# Patient Record
Sex: Female | Born: 1970 | Race: White | Hispanic: No | Marital: Single | Smoking: Former smoker
Health system: Southern US, Community
[De-identification: ages and names within clinical notes are randomized; demographics above are authoritative.]

## PROBLEM LIST (undated history)

## (undated) HISTORY — PX: APPENDECTOMY: SHX54

## (undated) HISTORY — PX: TONSILLECTOMY: SUR1361

## (undated) HISTORY — PX: TUBAL LIGATION: SHX77

---

## 2007-06-06 ENCOUNTER — Ambulatory Visit (HOSPITAL_COMMUNITY): Admission: RE | Admit: 2007-06-06 | Discharge: 2007-06-06 | Payer: Self-pay | Admitting: Occupational Medicine

## 2008-10-29 ENCOUNTER — Encounter: Payer: Self-pay | Admitting: *Deleted

## 2015-08-25 ENCOUNTER — Other Ambulatory Visit: Payer: Self-pay | Admitting: Obstetrics & Gynecology

## 2015-08-25 ENCOUNTER — Other Ambulatory Visit (HOSPITAL_COMMUNITY)
Admission: RE | Admit: 2015-08-25 | Discharge: 2015-08-25 | Disposition: A | Discharge status: Home / Self Care | Payer: 59 | Source: Ambulatory Visit | Attending: Obstetrics & Gynecology | Admitting: Obstetrics & Gynecology

## 2015-08-25 DIAGNOSIS — Z1151 Encounter for screening for human papillomavirus (HPV): Secondary | ICD-10-CM | POA: Diagnosis present

## 2015-08-25 DIAGNOSIS — Z01419 Encounter for gynecological examination (general) (routine) without abnormal findings: Secondary | ICD-10-CM | POA: Insufficient documentation

## 2015-08-29 LAB — CYTOLOGY - PAP

## 2015-09-08 ENCOUNTER — Other Ambulatory Visit: Payer: Self-pay

## 2015-09-08 DIAGNOSIS — Z1231 Encounter for screening mammogram for malignant neoplasm of breast: Secondary | ICD-10-CM

## 2015-09-23 ENCOUNTER — Ambulatory Visit
Admission: RE | Admit: 2015-09-23 | Discharge: 2015-09-23 | Disposition: A | Discharge status: Home / Self Care | Payer: 59 | Source: Ambulatory Visit

## 2015-09-23 DIAGNOSIS — Z1231 Encounter for screening mammogram for malignant neoplasm of breast: Secondary | ICD-10-CM

## 2016-12-04 ENCOUNTER — Other Ambulatory Visit: Payer: Self-pay | Admitting: Internal Medicine

## 2016-12-04 DIAGNOSIS — Z1231 Encounter for screening mammogram for malignant neoplasm of breast: Secondary | ICD-10-CM

## 2016-12-06 ENCOUNTER — Ambulatory Visit
Admission: RE | Admit: 2016-12-06 | Discharge: 2016-12-06 | Disposition: A | Discharge status: Home / Self Care | Payer: 59 | Source: Ambulatory Visit | Attending: Internal Medicine | Admitting: Internal Medicine

## 2016-12-06 DIAGNOSIS — Z1231 Encounter for screening mammogram for malignant neoplasm of breast: Secondary | ICD-10-CM

## 2017-07-16 ENCOUNTER — Emergency Department (HOSPITAL_COMMUNITY): Payer: Managed Care, Other (non HMO)

## 2017-07-16 ENCOUNTER — Other Ambulatory Visit: Payer: Self-pay

## 2017-07-16 ENCOUNTER — Emergency Department (HOSPITAL_COMMUNITY)
Admission: EM | Admit: 2017-07-16 | Discharge: 2017-07-16 | Disposition: A | Discharge status: Home / Self Care | Payer: Managed Care, Other (non HMO) | Attending: Emergency Medicine | Admitting: Emergency Medicine

## 2017-07-16 ENCOUNTER — Encounter (HOSPITAL_COMMUNITY): Payer: Self-pay | Admitting: Emergency Medicine

## 2017-07-16 DIAGNOSIS — J111 Influenza due to unidentified influenza virus with other respiratory manifestations: Secondary | ICD-10-CM | POA: Insufficient documentation

## 2017-07-16 DIAGNOSIS — J029 Acute pharyngitis, unspecified: Secondary | ICD-10-CM | POA: Diagnosis not present

## 2017-07-16 DIAGNOSIS — R0602 Shortness of breath: Secondary | ICD-10-CM | POA: Diagnosis present

## 2017-07-16 DIAGNOSIS — R6889 Other general symptoms and signs: Secondary | ICD-10-CM

## 2017-07-16 LAB — CBC WITH DIFFERENTIAL/PLATELET
BASOS ABS: 0 10*3/uL (ref 0.0–0.1)
BASOS PCT: 0 %
Eosinophils Absolute: 0.1 10*3/uL (ref 0.0–0.7)
Eosinophils Relative: 2 %
HEMATOCRIT: 36 % (ref 36.0–46.0)
HEMOGLOBIN: 11.6 g/dL — AB (ref 12.0–15.0)
Lymphocytes Relative: 34 %
Lymphs Abs: 2.2 10*3/uL (ref 0.7–4.0)
MCH: 26 pg (ref 26.0–34.0)
MCHC: 32.2 g/dL (ref 30.0–36.0)
MCV: 80.7 fL (ref 78.0–100.0)
MONOS PCT: 5 %
Monocytes Absolute: 0.3 10*3/uL (ref 0.1–1.0)
NEUTROS ABS: 3.9 10*3/uL (ref 1.7–7.7)
NEUTROS PCT: 59 %
Platelets: 243 10*3/uL (ref 150–400)
RBC: 4.46 MIL/uL (ref 3.87–5.11)
RDW: 15.9 % — ABNORMAL HIGH (ref 11.5–15.5)
WBC: 6.5 10*3/uL (ref 4.0–10.5)

## 2017-07-16 LAB — RAPID STREP SCREEN (MED CTR MEBANE ONLY): Streptococcus, Group A Screen (Direct): NEGATIVE

## 2017-07-16 NOTE — ED Notes (Signed)
E signature pad unavailable. Pt verbalizes understanding of dc instructions  

## 2017-07-16 NOTE — ED Triage Notes (Signed)
Pt. Stated, [facility] T[facility], Today I feel like I have a sore on my throat and feel SOB.

## 2017-07-16 NOTE — ED Provider Notes (Signed)
[facility] EMERGENCY DEPARTMENT Provider Note  CSN: [id number] Arrival date & time: 07/16/17 1705  Chief Complaint(s) Shortness of Breath and Sore Throat  HPI Tyanna Hach is a 47 y.o. female who has had flulike symptoms for the last several days and was diagnosed with the flu yesterday by urgent care center and placed on Tamiflu presents today for sore throat.  Patient reports that her throat began yesterday prior to being given the Tamiflu.  Is worse with swallowing.  Improved with peppermint candy.  She denies any oral swelling or respiratory distress.  Denies any emesis.  No other alleviating or aggravating factors.  Patient also endorses productive cough.  Denies any other physical complaints.  HPI  Past Medical History History reviewed. No pertinent past medical history. There are no active problems to display for this patient.  Home Medication(s) Prior to Admission medications   Medication Sig Start Date End Date Taking? Authorizing Provider  acetaminophen (TYLENOL) 500 MG tablet Take 2,000 mg by mouth every 6 (six) hours as needed.   Yes [provider]  fluticasone-salmeterol (ADVAIR HFA) 115-21 MCG/ACT inhaler Inhale 2 puffs into the lungs as needed.   Yes [provider]  ibuprofen (ADVIL,MOTRIN) 800 MG tablet Take 800 mg by mouth every 6 (six) hours as needed.  06/02/17  Yes [provider]  oseltamivir (TAMIFLU) 75 MG capsule Take 75 mg by mouth 2 (two) times daily.   Yes [provider]                                                                                                                                    Past Surgical History History reviewed. No pertinent surgical history. Family History No family history on file.  Social History Social History   Tobacco Use  . Smoking status: Never Smoker  . Smokeless tobacco: Never Used  Substance Use Topics  . Alcohol use: No    Frequency: Never  . Drug use: No    Allergies Patient has no known allergies.  Review of Systems Review of Systems All other systems are reviewed and are negative for acute change except as noted in the HPI  Physical Exam Vital Signs  I have reviewed the triage vital signs BP 133/87 (BP Location: Left Arm)   Pulse 75   Temp 99.3 F (37.4 C) (Oral)   Resp 17   Ht 5\' 5"  (1.651 m)   Wt 115.7 kg (255 lb)   LMP 06/18/2017   SpO2 99%   BMI 42.43 kg/m   Physical Exam  Constitutional: She is oriented to person, place, and time. She appears well-developed and well-nourished. No distress.  HENT:  Head: Normocephalic and atraumatic.  Nose: Nose normal.  Mouth/Throat: No oral lesions. No uvula swelling. Posterior oropharyngeal erythema (mild with postnasal drip) present. No posterior oropharyngeal edema. No tonsillar exudate.  Eyes: Conjunctivae and EOM are normal. Pupils are equal, round, and  reactive to light. Right eye exhibits no discharge. Left eye exhibits no discharge. No scleral icterus.  Neck: Normal range of motion. Neck supple.  Cardiovascular: Normal rate and regular rhythm. Exam reveals no gallop and no friction rub.  No murmur heard. Pulmonary/Chest: Effort normal and breath sounds normal. No stridor. No respiratory distress. She has no rales.  Abdominal: Soft. She exhibits no distension. There is no tenderness.  Musculoskeletal: She exhibits no edema or tenderness.  Neurological: She is alert and oriented to person, place, and time.  Skin: Skin is warm and dry. No rash noted. She is not diaphoretic. No erythema.  Psychiatric: She has a normal mood and affect.  Vitals reviewed.   ED Results and Treatments Labs (all labs ordered are listed, but only abnormal results are displayed) Labs Reviewed  CBC WITH DIFFERENTIAL/PLATELET - Abnormal; Notable for the following components:      Result Value   Hemoglobin 11.6 (*)    RDW 15.9 (*)    All other components within normal limits  RAPID STREP SCREEN  (NOT AT [facility])  CULTURE, GROUP A STREP [facility])                                                                                                                         EKG  EKG Interpretation  Date/Time:    Ventricular Rate:    PR Interval:    QRS Duration:   QT Interval:    QTC Calculation:   R Axis:     Text Interpretation:        Radiology Dg Chest 2 View  Result Date: 07/16/2017 CLINICAL DATA:  47 year old female with recent diagnosis of the flu, chest pressure and body aches EXAM: CHEST  2 VIEW COMPARISON:  Prior chest x-ray 06/06/2007 FINDINGS: The lungs are clear and negative for focal airspace consolidation, pulmonary edema or suspicious pulmonary nodule. No pleural effusion or pneumothorax. Cardiac and mediastinal contours are within normal limits. No acute fracture or lytic or blastic osseous lesions. The visualized upper abdominal bowel gas pattern is unremarkable. IMPRESSION: No active cardiopulmonary disease. Electronically Signed   By: Malachy Moan M.D.   On: 07/16/2017 18:17   Pertinent labs & imaging results that were available during my care of the patient were reviewed by me and considered in my medical decision making (see chart for details).  Medications Ordered in ED Medications - No data to display  Procedures Procedures  (including critical care time)  Medical Decision Making / ED Course I have reviewed the nursing notes for this encounter and the patient's prior records (if available in EHR or on provided paperwork).    47 y.o. female presents with flu-like symptoms for several days.  adequate oral hydration. Rest of history as above.  Patient appears well. No signs of toxicity, patient is interactive. No hypoxia, tachypnea or other signs of respiratory distress. No sign of clinical dehydration.  Oropharynx with mild erythema  and evidence of postnasal drip.  No oral swelling.  Lung exam clear. Rest of exam as above.  Rapid strep was negative. CXR w/o PNA.  Most consistent with flu-like illness   No evidence suggestive of pharyngitis, AOM, PNA, or meningitis.  Discussed symptomatic treatment with the patient and they will follow closely with their PCP.    Final Clinical Impression(s) / ED Diagnoses Final diagnoses:  Flu-like symptoms  Sore throat    Disposition: Discharge  Condition: Good  I have discussed the results, Dx and Tx plan with the patient who expressed understanding and agree(s) with the plan. Discharge instructions discussed at great length. The patient was given strict return precautions who verbalized understanding of the instructions. No further questions at time of discharge.    ED Discharge Orders    None       Follow Up: Dorothyann PengSanders, Robyn, MD [address] [place] [id number] [phone]  Schedule an appointment as soon as possible for a visit  As needed     This chart was dictated using voice recognition software.  Despite best efforts to proofread,  errors can occur which can change the documentation meaning.   Nira Connardama, Pedro Eduardo, MD 07/16/17 2257

## 2017-07-16 NOTE — ED Notes (Signed)
ED Provider at bedside. 

## 2017-07-19 LAB — CULTURE, GROUP A STREP (THRC)

## 2019-08-29 IMAGING — CR DG CHEST 2V
2 series · 2 of 2 positions shown · non-contrast
Comparison: Prior chest x-ray 06/06/2007

CLINICAL DATA: 46-year-old female with recent diagnosis of the flu,
chest pressure and body aches

EXAM:
CHEST  2 VIEW

[chest pa]
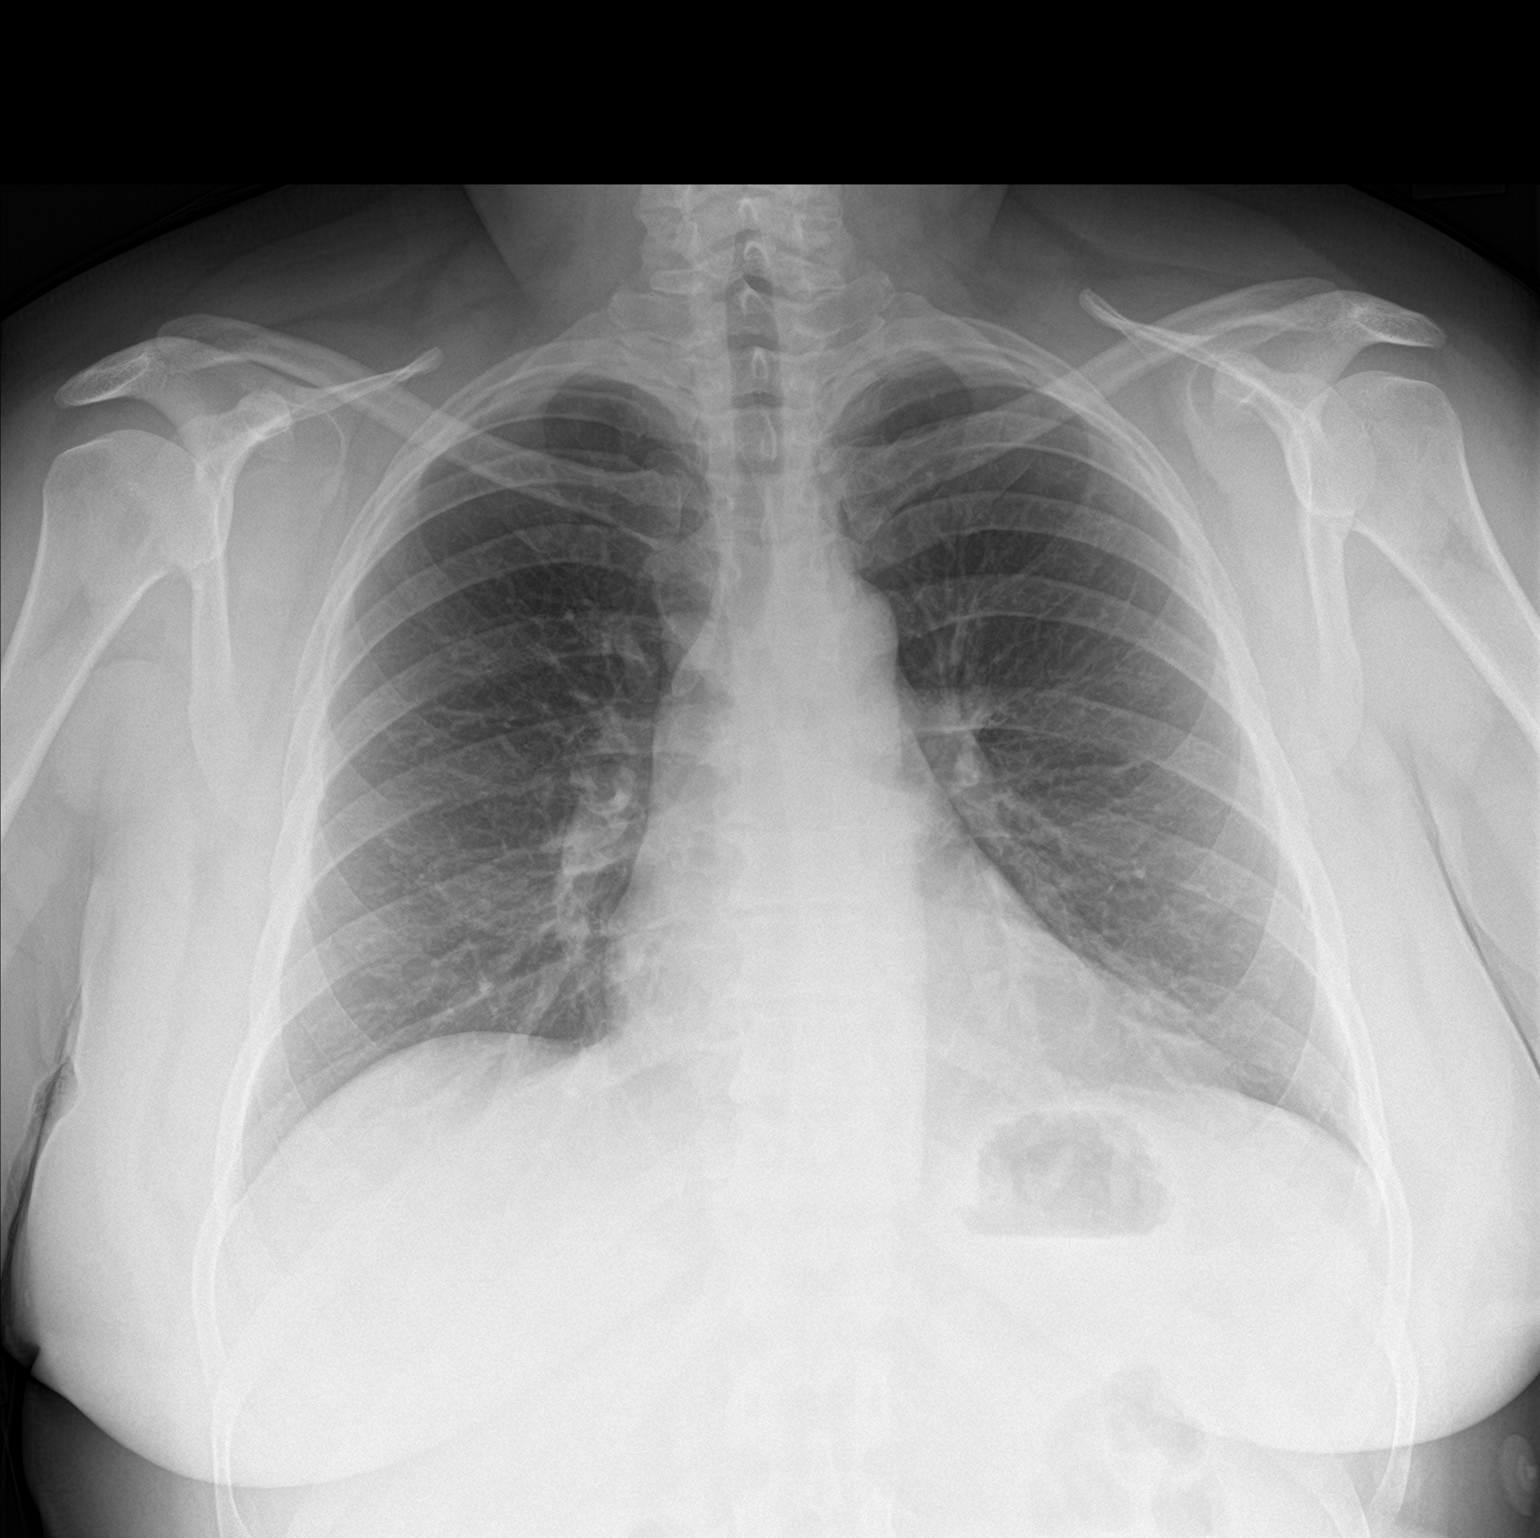

[chest lat]
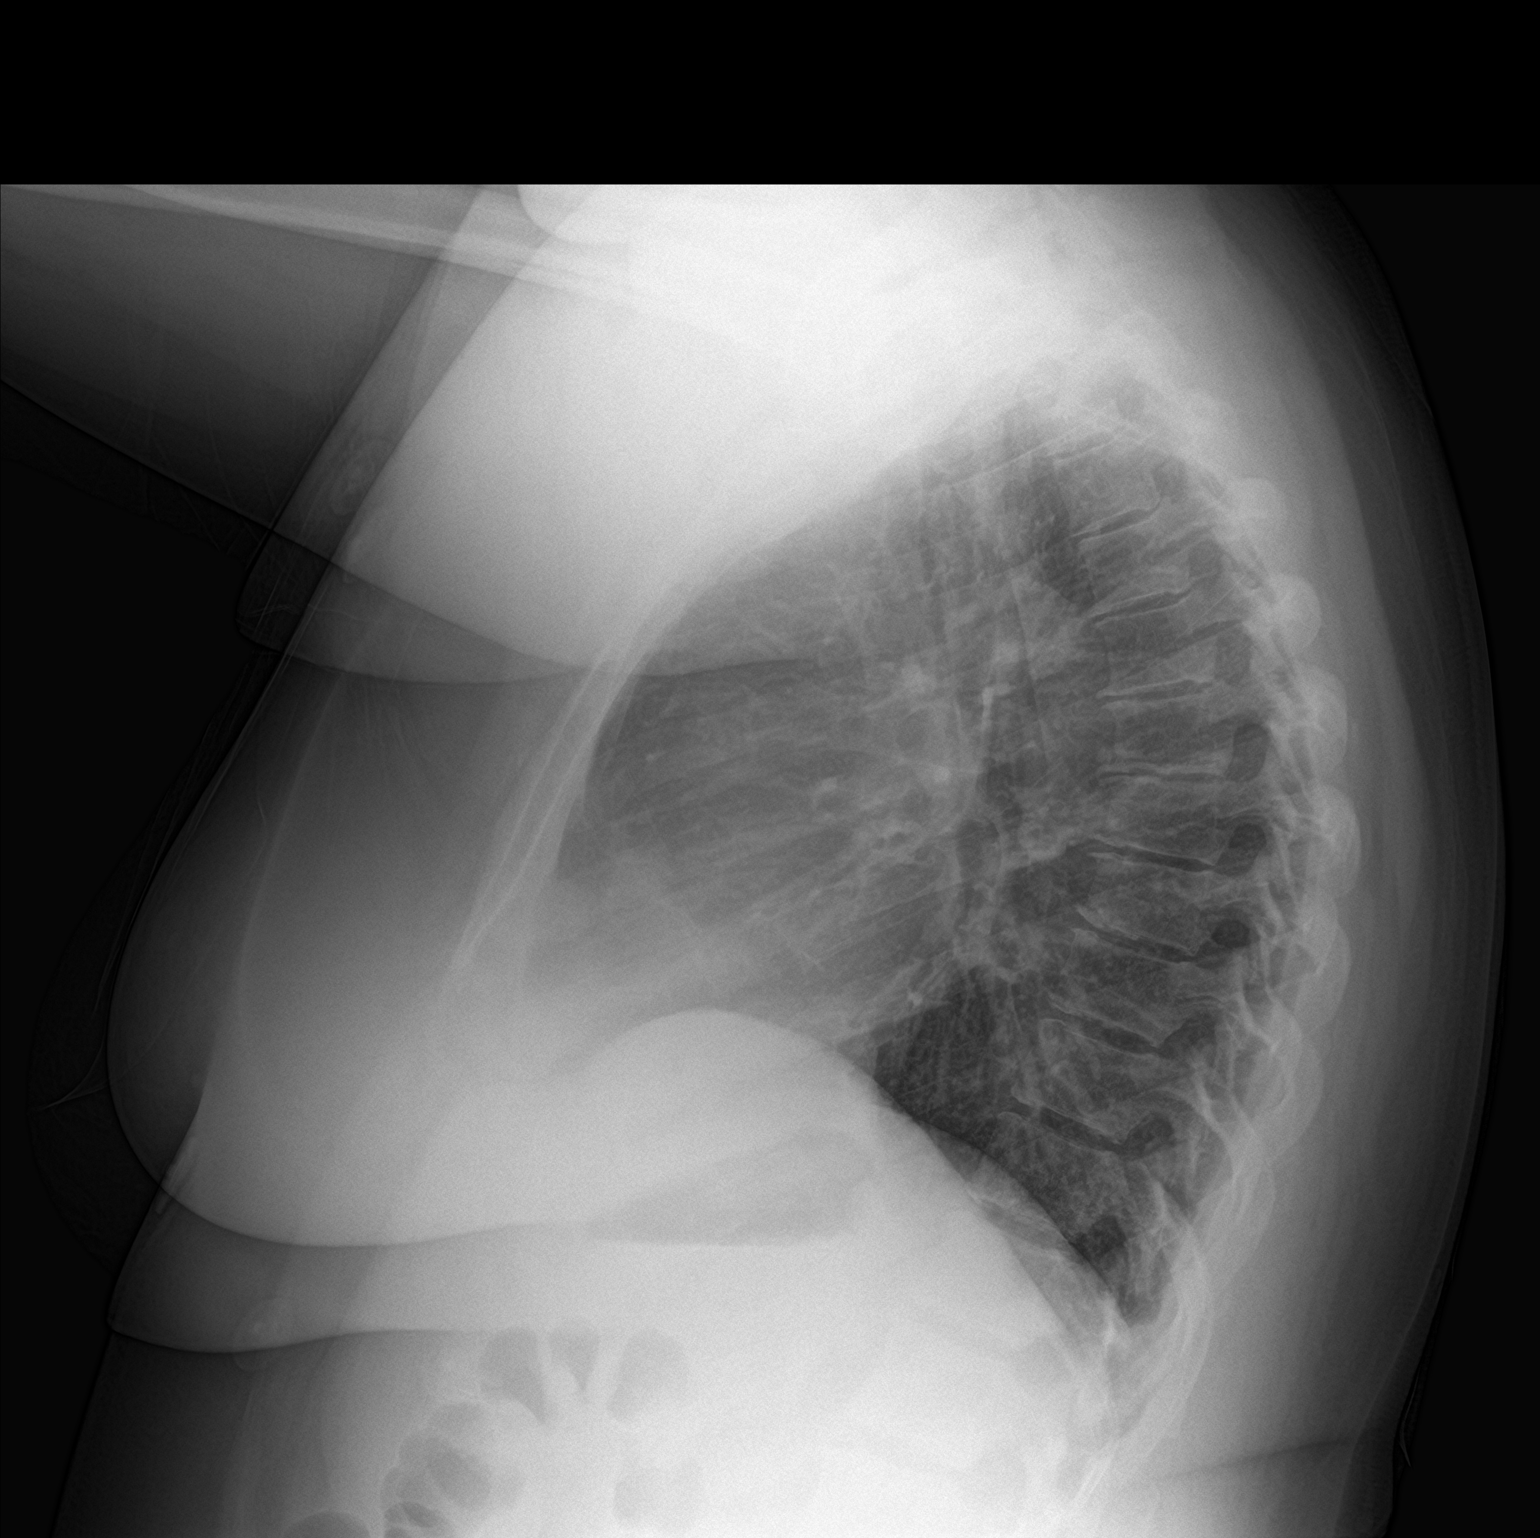

[2 of 2 positions shown; findings below may reference images not displayed]

FINDINGS: The lungs are clear and negative for focal airspace consolidation,
pulmonary edema or suspicious pulmonary nodule. No pleural effusion
or pneumothorax. Cardiac and mediastinal contours are within normal
limits. No acute fracture or lytic or blastic osseous lesions. The
visualized upper abdominal bowel gas pattern is unremarkable.
IMPRESSION: No active cardiopulmonary disease.

## 2022-04-18 ENCOUNTER — Ambulatory Visit (INDEPENDENT_AMBULATORY_CARE_PROVIDER_SITE_OTHER): Payer: 59 | Admitting: Radiology

## 2022-04-18 ENCOUNTER — Other Ambulatory Visit (HOSPITAL_COMMUNITY)
Admission: RE | Admit: 2022-04-18 | Discharge: 2022-04-18 | Disposition: A | Discharge status: Home / Self Care | Payer: 59 | Source: Ambulatory Visit | Attending: Radiology | Admitting: Radiology

## 2022-04-18 ENCOUNTER — Encounter: Payer: Self-pay | Admitting: Radiology

## 2022-04-18 VITALS — BP 116/74 | Ht 64.75 in | Wt 258.0 lb

## 2022-04-18 DIAGNOSIS — Z01419 Encounter for gynecological examination (general) (routine) without abnormal findings: Secondary | ICD-10-CM | POA: Insufficient documentation

## 2022-04-18 DIAGNOSIS — Z1211 Encounter for screening for malignant neoplasm of colon: Secondary | ICD-10-CM

## 2022-04-18 NOTE — Progress Notes (Signed)
Darlene Payne Oct 13, 1970 [id number]   History:  51 y.o. G6P4 presents for annual exam as a new patient. She reports irregular periods over the past year. Does not have a PCP, looking to establish. No gyn concerns.   Gynecologic History Patient's last menstrual period was 04/18/2022 (exact date). Period Duration (Days): 6 Period Pattern: (!) Irregular Menstrual Flow: Heavy Menstrual Control: Maxi pad Dysmenorrhea: (!) Mild Dysmenorrhea Symptoms: Cramping Contraception/Family planning: tubal ligation Sexually active: yes Last Pap: 2017. Results were: normal Last mammogram: 2018. Results were: normal  Obstetric History OB History  Gravida Para Term Preterm AB Living  6 4       4   SAB IAB Ectopic Multiple Live Births          4    # Outcome Date GA Lbr Len/2nd Weight Sex Delivery Anes PTL Lv  6 Gravida           5 Gravida           4 Para           3 Para           2 Para           1 Para              The following portions of the patient's history were reviewed and updated as appropriate: allergies, current medications, past family history, past medical history, past social history, past surgical history, and problem list.  Review of Systems Pertinent items noted in HPI and remainder of comprehensive ROS otherwise negative.   Past medical history, past surgical history, family history and social history were all reviewed and documented in the EPIC chart.   Exam:  Vitals:   04/18/22 1454  BP: 116/74  Weight: 258 lb (117 kg)  Height: 5' 4.75" (1.645 m)   Body mass index is 43.27 kg/m.  General appearance:  Normal, obese Thyroid:  Symmetrical, normal in size, without palpable masses or nodularity. Respiratory  Auscultation:  Clear without wheezing or rhonchi Cardiovascular  Auscultation:  Regular rate, without rubs, murmurs or gallops  Edema/varicosities:  Not grossly evident Abdominal  Soft,nontender, without masses, guarding or rebound.  Liver/spleen:  No  organomegaly noted  Hernia:  None appreciated  Skin  Inspection:  Grossly normal Breasts: Examined lying and sitting.   Right: Without masses, retractions, nipple discharge or axillary adenopathy.   Left: Without masses, retractions, nipple discharge or axillary adenopathy. Genitourinary   Inguinal/mons:  Normal without inguinal adenopathy  External genitalia:  Normal appearing vulva with no masses, tenderness, or lesions  BUS/Urethra/Skene's glands:  Normal without masses or exudate  Vagina:  Normal appearing with normal color and discharge, no lesions  Cervix:  Normal appearing without discharge or lesions  Uterus:  Normal in size, shape and contour.  Mobile, nontender  Adnexa/parametria:     Rt: Normal in size, without masses or tenderness.   Lt: Normal in size, without masses or tenderness.  Anus and perineum: Normal   Patient informed chaperone available to be present for breast and pelvic exam. Patient has requested no chaperone to be present. Patient has been advised what will be completed during breast and pelvic exam.   Assessment/Plan:   1. Well woman exam with routine gynecological exam -schedule mammogram - Will establish PCP, plans to have her labs done there  - Cytology - PAP( [facility])  2. Screen for colon cancer - Cologuard     Discussed SBE, colonoscopy and DEXA screening as directed/appropriate.  Recommend 181mins of exercise weekly, including weight bearing exercise. Encouraged the use of seatbelts and sunscreen. Return in 1 year for annual or as needed.   Darlene Payne B WHNP-BC 3:18 PM 04/18/2022

## 2022-04-19 ENCOUNTER — Other Ambulatory Visit: Payer: Self-pay | Admitting: Internal Medicine

## 2022-04-19 DIAGNOSIS — Z1231 Encounter for screening mammogram for malignant neoplasm of breast: Secondary | ICD-10-CM

## 2022-04-23 LAB — CYTOLOGY - PAP
Adequacy: ABSENT
Comment: NEGATIVE
Diagnosis: NEGATIVE
Diagnosis: REACTIVE
High risk HPV: NEGATIVE

## 2022-05-17 LAB — COLOGUARD: COLOGUARD: NEGATIVE

## 2022-06-18 DIAGNOSIS — Z1231 Encounter for screening mammogram for malignant neoplasm of breast: Secondary | ICD-10-CM

## 2022-06-20 ENCOUNTER — Ambulatory Visit: Payer: Self-pay | Admitting: Family Medicine
